# Patient Record
Sex: Female | Born: 1960
Health system: Southern US, Community
[De-identification: ages and names within clinical notes are randomized; demographics above are authoritative.]

## PROBLEM LIST (undated history)

## (undated) DIAGNOSIS — E039 Hypothyroidism, unspecified: Secondary | ICD-10-CM

## (undated) DIAGNOSIS — K219 Gastro-esophageal reflux disease without esophagitis: Secondary | ICD-10-CM

## (undated) HISTORY — PX: TMJ ARTHROSCOPY: SHX1067

## (undated) HISTORY — PX: COLONOSCOPY W/ BIOPSIES AND POLYPECTOMY: SHX1376

---

## 2011-07-29 ENCOUNTER — Other Ambulatory Visit: Payer: Self-pay | Admitting: Endocrinology

## 2011-07-29 DIAGNOSIS — E041 Nontoxic single thyroid nodule: Secondary | ICD-10-CM

## 2011-08-25 ENCOUNTER — Inpatient Hospital Stay: Admission: RE | Admit: 2011-08-25 | Payer: Self-pay | Source: Ambulatory Visit

## 2012-08-20 ENCOUNTER — Other Ambulatory Visit: Payer: Self-pay | Admitting: Endocrinology

## 2012-08-20 DIAGNOSIS — E041 Nontoxic single thyroid nodule: Secondary | ICD-10-CM

## 2012-08-31 ENCOUNTER — Ambulatory Visit
Admission: RE | Admit: 2012-08-31 | Discharge: 2012-08-31 | Disposition: A | Discharge status: Home / Self Care | Payer: 59 | Source: Ambulatory Visit | Attending: Endocrinology | Admitting: Endocrinology

## 2012-08-31 DIAGNOSIS — E041 Nontoxic single thyroid nodule: Secondary | ICD-10-CM

## 2013-05-06 ENCOUNTER — Other Ambulatory Visit: Payer: Self-pay | Admitting: Gastroenterology

## 2013-05-06 DIAGNOSIS — R110 Nausea: Secondary | ICD-10-CM

## 2013-05-06 DIAGNOSIS — R1013 Epigastric pain: Secondary | ICD-10-CM

## 2013-05-16 ENCOUNTER — Ambulatory Visit (HOSPITAL_COMMUNITY)
Admission: RE | Admit: 2013-05-16 | Discharge: 2013-05-16 | Disposition: A | Discharge status: Home / Self Care | Payer: 59 | Source: Ambulatory Visit | Attending: Gastroenterology | Admitting: Gastroenterology

## 2013-05-16 DIAGNOSIS — R1013 Epigastric pain: Secondary | ICD-10-CM

## 2013-05-16 DIAGNOSIS — R110 Nausea: Secondary | ICD-10-CM

## 2013-05-16 DIAGNOSIS — K8020 Calculus of gallbladder without cholecystitis without obstruction: Secondary | ICD-10-CM | POA: Insufficient documentation

## 2013-05-24 ENCOUNTER — Ambulatory Visit (INDEPENDENT_AMBULATORY_CARE_PROVIDER_SITE_OTHER): Payer: 59 | Admitting: General Surgery

## 2013-05-25 ENCOUNTER — Ambulatory Visit (HOSPITAL_COMMUNITY): Payer: 59

## 2013-05-30 ENCOUNTER — Other Ambulatory Visit (HOSPITAL_COMMUNITY): Payer: 59

## 2013-06-07 ENCOUNTER — Ambulatory Visit (INDEPENDENT_AMBULATORY_CARE_PROVIDER_SITE_OTHER): Payer: 59 | Admitting: General Surgery

## 2013-06-07 ENCOUNTER — Encounter (INDEPENDENT_AMBULATORY_CARE_PROVIDER_SITE_OTHER): Payer: Self-pay | Admitting: General Surgery

## 2013-06-07 VITALS — BP 122/84 | HR 72 | Resp 14 | Ht 65.0 in | Wt 158.4 lb

## 2013-06-07 DIAGNOSIS — K8020 Calculus of gallbladder without cholecystitis without obstruction: Secondary | ICD-10-CM | POA: Insufficient documentation

## 2013-06-07 NOTE — Progress Notes (Signed)
Patient ID: Amy Mercado, female   DOB: 01/02/1961, 52 y.o.   MRN: [id number]  Chief Complaint  Patient presents with  . Cholelithiasis    new pt    HPI Amy Mercado is a 52 y.o. female.  The patient comes in for abdominal pain extending over the past 2 years.  HPI The patient recently had an ultrasound demonstrating gallstones with no evidence of acute cholecystitis or obstruction. We'll the past 2 years she's had a workup for this relapsing abdominal pain in the epigastrium radiating sometimes to her back in the right upper quadrant. Over the past 2 months she's had nausea associated with this discomfort. She has had occasional vomiting. She's had no fevers or chills. She's had no diarrhea and no jaundice. The patient is chronically constipated and has recently had an upper GI endoscopy and a colonoscopy both of which demonstrated only benign polyps.  After ultrasound was done a surgical consultation was obtained and the patient was brought in for evaluation. No past medical history on file.  Past Surgical History  Procedure Laterality Date  . Cesarean section    . Tmj arthroscopy      No family history on file.  Social History History  Substance Use Topics  . Smoking status: Never Smoker   . Smokeless tobacco: Not on file  . Alcohol Use: Not on file    No Known Allergies  Current Outpatient Prescriptions  Medication Sig Dispense Refill  . busPIRone (BUSPAR) 15 MG tablet       . levothyroxine (SYNTHROID, LEVOTHROID) 125 MCG tablet       . pantoprazole (PROTONIX) 40 MG tablet       . polyethylene glycol-electrolytes (NULYTELY/GOLYTELY) 420 G solution       . Saccharomyces boulardii (FLORASTOR PO) Take by mouth.      . traMADol (ULTRAM) 50 MG tablet        No current facility-administered medications for this visit.    Review of Systems Review of Systems  Constitutional: Positive for appetite change. Negative for fever and chills.  HENT: Negative.   Eyes: Negative.    Respiratory: Negative.   Cardiovascular: Positive for chest pain (epigastric).  Gastrointestinal: Positive for nausea, abdominal pain and constipation.  Endocrine: Negative.   Genitourinary: Negative.   Allergic/Immunologic: Negative.   Neurological: Negative.   Hematological: Negative.   Psychiatric/Behavioral: Negative.     Blood pressure 122/84, pulse 72, resp. rate 14, height 5' 5" (1.651 m), weight 158 lb 6.4 oz (71.85 kg).  Physical Exam Physical Exam  Constitutional: She is oriented to person, place, and time. She appears well-developed and well-nourished.  HENT:  Head: Normocephalic and atraumatic.  Eyes: Conjunctivae and EOM are normal. Pupils are equal, round, and reactive to light.  Cardiovascular: Normal rate and normal heart sounds.   Pulmonary/Chest: Effort normal and breath sounds normal.  Abdominal: Soft. Bowel sounds are normal. There is tenderness in the right upper quadrant and epigastric area.  Musculoskeletal: Normal range of motion.  Neurological: She is alert and oriented to person, place, and time. She has normal reflexes.  Skin: Skin is warm and dry.  Psychiatric: She has a normal mood and affect. Her behavior is normal. Judgment and thought content normal.    Data Reviewed The results from her laboratory studies done by her primary care physician and referring doctor were reviewed.  Assessment    Likely symptomatic cholelithiasis with intermittent biliary colic.  Do not believe the patient has had any problems with   acute cholecystitis.     Plan    I have recommended that the patient have a laparoscopic cholecystectomy with a cholangiogram. The patient and her husband wish to think about this and get back with us. I will go ahead and put in orders for surgery in the patient can call back to schedule.        Helon Wisinski O 06/07/2013, 9:00 AM    

## 2013-06-09 ENCOUNTER — Telehealth (INDEPENDENT_AMBULATORY_CARE_PROVIDER_SITE_OTHER): Payer: Self-pay

## 2013-06-09 NOTE — Telephone Encounter (Signed)
Pt calling to schedule her surgery.  Has many questions about the cost, etc.  Told her that surgery scheduling will contact her as soon as possible.

## 2013-06-17 ENCOUNTER — Encounter (HOSPITAL_COMMUNITY): Payer: Self-pay | Admitting: Pharmacy Technician

## 2013-06-22 ENCOUNTER — Encounter (HOSPITAL_COMMUNITY)
Admission: RE | Admit: 2013-06-22 | Discharge: 2013-06-22 | Disposition: A | Discharge status: Home / Self Care | Payer: 59 | Source: Ambulatory Visit | Attending: General Surgery | Admitting: General Surgery

## 2013-06-22 ENCOUNTER — Encounter (HOSPITAL_COMMUNITY): Payer: Self-pay

## 2013-06-22 DIAGNOSIS — Z01812 Encounter for preprocedural laboratory examination: Secondary | ICD-10-CM | POA: Insufficient documentation

## 2013-06-22 HISTORY — DX: Gastro-esophageal reflux disease without esophagitis: K21.9

## 2013-06-22 HISTORY — DX: Hypothyroidism, unspecified: E03.9

## 2013-06-22 LAB — COMPREHENSIVE METABOLIC PANEL
ALT: 9 U/L (ref 0–35)
AST: 14 U/L (ref 0–37)
Albumin: 3.8 g/dL (ref 3.5–5.2)
Alkaline Phosphatase: 49 U/L (ref 39–117)
BILIRUBIN TOTAL: 0.3 mg/dL (ref 0.3–1.2)
BUN: 9 mg/dL (ref 6–23)
CO2: 24 mEq/L (ref 19–32)
CREATININE: 0.72 mg/dL (ref 0.50–1.10)
Calcium: 8.9 mg/dL (ref 8.4–10.5)
Chloride: 101 mEq/L (ref 96–112)
GFR calc non Af Amer: >90 mL/min (ref >90)
Glucose, Bld: 101 mg/dL — ABNORMAL HIGH (ref 70–99)
Potassium: 3.7 mEq/L (ref 3.7–5.3)
Sodium: 139 mEq/L (ref 137–147)
TOTAL PROTEIN: 7.3 g/dL (ref 6.0–8.3)

## 2013-06-22 LAB — CBC WITH DIFFERENTIAL/PLATELET
Basophils Absolute: 0 10*3/uL (ref 0.0–0.1)
Basophils Relative: 0 % (ref 0–1)
EOS ABS: 0 10*3/uL (ref 0.0–0.7)
Eosinophils Relative: 0 % (ref 0–5)
HEMATOCRIT: 36.1 % (ref 36.0–46.0)
HEMOGLOBIN: 12.1 g/dL (ref 12.0–15.0)
Lymphocytes Relative: 37 % (ref 12–46)
Lymphs Abs: 2.9 10*3/uL (ref 0.7–4.0)
MCH: 29.9 pg (ref 26.0–34.0)
MCHC: 33.5 g/dL (ref 30.0–36.0)
MCV: 89.1 fL (ref 78.0–100.0)
MONO ABS: 0.4 10*3/uL (ref 0.1–1.0)
MONOS PCT: 5 % (ref 3–12)
Neutro Abs: 4.4 10*3/uL (ref 1.7–7.7)
Neutrophils Relative %: 58 % (ref 43–77)
Platelets: 257 10*3/uL (ref 150–400)
RBC: 4.05 MIL/uL (ref 3.87–5.11)
RDW: 13.7 % (ref 11.5–15.5)
WBC: 7.7 10*3/uL (ref 4.0–10.5)

## 2013-06-22 LAB — HCG, SERUM, QUALITATIVE: Preg, Serum: NEGATIVE

## 2013-06-22 NOTE — Pre-Procedure Instructions (Signed)
Amy Mercado  06/22/2013   Your procedure is scheduled on: Wednesday, June 29, 2013 at 10:00 AM  [facility] (use Main Entrance "A'') at 8:00 AM.  Call this number if you have problems the morning of surgery: [phone]   Remember:   Do not eat food or drink liquids after midnight.   Take these medicines the morning of surgery with A SIP OF WATER: busPIRone (BUSPAR), levothyroxine (SYNTHROID, LEVOTHROID), pantoprazole (PROTONIX Stop taking Aspirin, vitamins and herbal medications. Do not take any NSAIDs ie: Ibuprofen, Advil, Naproxen or any medication containing Aspirin.  Do not wear jewelry, make-up or nail polish.  Do not wear lotions, powders, or perfumes. You may NOT wear deodorant.  Do not shave 48 hours prior to surgery.   Do not bring valuables to the hospital.  [facility] is not responsible for any belongings or valuables.               Contacts, dentures or bridgework may not be worn into surgery.  Leave suitcase in the car. After surgery it may be brought to your room.  For patients admitted to the hospital, discharge time is determined by your treatment team.               Patients discharged the day of surgery will not be allowed to drive home.  Name and phone number of your driver:   Special Instructions:  Special Instructions:Special Instructions: [facility] - Preparing for Surgery  Before surgery, you can play an important role.  Because skin is not sterile, your skin needs to be as free of germs as possible.  You can reduce the number of germs on you skin by washing with CHG (chlorahexidine gluconate) soap before surgery.  CHG is an antiseptic cleaner which kills germs and bonds with the skin to continue killing germs even after washing.  Please DO NOT use if you have an allergy to CHG or antibacterial soaps.  If your skin becomes reddened/irritated stop using the CHG and inform your nurse when you arrive at Short Stay.  Do not shave (including  legs and underarms) for at least 48 hours prior to the first CHG shower.  You may shave your face.  Please follow these instructions carefully:   1.  Shower with CHG Soap the night before surgery and the morning of Surgery.  2.  If you choose to wash your hair, wash your hair first as usual with your normal shampoo.  3.  After you shampoo, rinse your hair and body thoroughly to remove the Shampoo.  4.  Use CHG as you would any other liquid soap.  You can apply chg directly  to the skin and wash gently with scrungie or a clean washcloth.  5.  Apply the CHG Soap to your body ONLY FROM THE NECK DOWN.  Do not use on open wounds or open sores.  Avoid contact with your eyes, ears, mouth and genitals (private parts).  Wash genitals (private parts) with your normal soap.  6.  Wash thoroughly, paying special attention to the area where your surgery will be performed.  7.  Thoroughly rinse your body with warm water from the neck down.  8.  DO NOT shower/wash with your normal soap after using and rinsing off the CHG Soap.  9.  Pat yourself dry with a clean towel.            10.  Wear clean pajamas.  11.  Place clean sheets on your bed the night of your first shower and do not sleep with pets.  Day of Surgery  Do not apply any lotions/deodorants the morning of surgery.  Please wear clean clothes to the hospital/surgery center.   Please read over the following fact sheets that you were given: Pain Booklet, Coughing and Deep Breathing and Surgical Site Infection Prevention

## 2013-06-23 ENCOUNTER — Encounter (INDEPENDENT_AMBULATORY_CARE_PROVIDER_SITE_OTHER): Payer: Self-pay

## 2013-06-23 NOTE — Progress Notes (Signed)
Called Amy Mercado today to see if she had found the physician's name or where she had her stress test and Echo done. She states she has not had a chance to do that yet, but will tonight and call me with the info in the AM.

## 2013-06-27 NOTE — Progress Notes (Addendum)
[facility] Was told by that office to call [facility] bc the [facility] have [facility]

## 2013-06-28 MED ORDER — DEXTROSE 5 % IV SOLN
2.0000 g | INTRAVENOUS | Status: AC
  Administered 2013-06-29: 2 g via INTRAVENOUS
  Filled 2013-06-28: qty 2

## 2013-06-29 ENCOUNTER — Encounter (HOSPITAL_COMMUNITY): Payer: Self-pay | Admitting: Certified Registered"

## 2013-06-29 ENCOUNTER — Ambulatory Visit (HOSPITAL_COMMUNITY): Payer: 59 | Admitting: Certified Registered"

## 2013-06-29 ENCOUNTER — Encounter (HOSPITAL_COMMUNITY): Payer: 59 | Admitting: Certified Registered"

## 2013-06-29 ENCOUNTER — Ambulatory Visit (HOSPITAL_COMMUNITY): Payer: 59

## 2013-06-29 ENCOUNTER — Ambulatory Visit (HOSPITAL_COMMUNITY)
Admission: RE | Admit: 2013-06-29 | Discharge: 2013-06-29 | Disposition: A | Discharge status: Home / Self Care | Payer: 59 | Source: Ambulatory Visit | Attending: General Surgery | Admitting: General Surgery

## 2013-06-29 ENCOUNTER — Encounter (HOSPITAL_COMMUNITY)
Admission: RE | Disposition: A | Discharge status: Home / Self Care | Payer: Self-pay | Source: Ambulatory Visit | Attending: General Surgery

## 2013-06-29 DIAGNOSIS — K8020 Calculus of gallbladder without cholecystitis without obstruction: Secondary | ICD-10-CM

## 2013-06-29 DIAGNOSIS — K219 Gastro-esophageal reflux disease without esophagitis: Secondary | ICD-10-CM | POA: Insufficient documentation

## 2013-06-29 DIAGNOSIS — E039 Hypothyroidism, unspecified: Secondary | ICD-10-CM | POA: Insufficient documentation

## 2013-06-29 DIAGNOSIS — Z87891 Personal history of nicotine dependence: Secondary | ICD-10-CM | POA: Insufficient documentation

## 2013-06-29 DIAGNOSIS — K8010 Calculus of gallbladder with chronic cholecystitis without obstruction: Secondary | ICD-10-CM

## 2013-06-29 HISTORY — PX: CHOLECYSTECTOMY: SHX55

## 2013-06-29 SURGERY — LAPAROSCOPIC CHOLECYSTECTOMY WITH INTRAOPERATIVE CHOLANGIOGRAM
Anesthesia: General | Site: Abdomen

## 2013-06-29 MED ORDER — ROCURONIUM BROMIDE 50 MG/5ML IV SOLN
INTRAVENOUS | Status: AC
  Filled 2013-06-29: qty 1

## 2013-06-29 MED ORDER — EPHEDRINE SULFATE 50 MG/ML IJ SOLN
INTRAMUSCULAR | Status: AC
  Filled 2013-06-29: qty 1

## 2013-06-29 MED ORDER — ONDANSETRON HCL 4 MG/2ML IJ SOLN: 4.0000 mg | Freq: Four times a day (QID) | INTRAMUSCULAR | Status: DC | PRN

## 2013-06-29 MED ORDER — MIDAZOLAM HCL 5 MG/5ML IJ SOLN
INTRAMUSCULAR | Status: DC | PRN
  Administered 2013-06-29: 2 mg via INTRAVENOUS

## 2013-06-29 MED ORDER — HYDROMORPHONE HCL PF 1 MG/ML IJ SOLN
INTRAMUSCULAR | Status: AC
  Filled 2013-06-29: qty 1

## 2013-06-29 MED ORDER — GLYCOPYRROLATE 0.2 MG/ML IJ SOLN
INTRAMUSCULAR | Status: DC | PRN
  Administered 2013-06-29: 0.4 mg via INTRAVENOUS

## 2013-06-29 MED ORDER — ONDANSETRON HCL 4 MG/2ML IJ SOLN
INTRAMUSCULAR | Status: DC | PRN
  Administered 2013-06-29: 4 mg via INTRAVENOUS

## 2013-06-29 MED ORDER — PHENYLEPHRINE HCL 10 MG/ML IJ SOLN
INTRAMUSCULAR | Status: DC | PRN
  Administered 2013-06-29: 160 ug via INTRAVENOUS

## 2013-06-29 MED ORDER — 0.9 % SODIUM CHLORIDE (POUR BTL) OPTIME
TOPICAL | Status: DC | PRN
  Administered 2013-06-29: 1000 mL

## 2013-06-29 MED ORDER — ONDANSETRON HCL 4 MG/2ML IJ SOLN
INTRAMUSCULAR | Status: AC
  Filled 2013-06-29: qty 2

## 2013-06-29 MED ORDER — LIDOCAINE HCL (CARDIAC) 20 MG/ML IV SOLN
INTRAVENOUS | Status: AC
  Filled 2013-06-29: qty 5

## 2013-06-29 MED ORDER — IOHEXOL 300 MG/ML  SOLN
INTRAMUSCULAR | Status: DC | PRN
  Administered 2013-06-29: 10:00:00

## 2013-06-29 MED ORDER — PROPOFOL 10 MG/ML IV BOLUS
INTRAVENOUS | Status: AC
Start: 2013-06-29 — End: 2013-06-29
  Filled 2013-06-29: qty 20

## 2013-06-29 MED ORDER — OXYCODONE HCL 5 MG PO TABS
ORAL_TABLET | ORAL | Status: AC
  Filled 2013-06-29: qty 1

## 2013-06-29 MED ORDER — BUPIVACAINE-EPINEPHRINE PF 0.25-1:200000 % IJ SOLN
INTRAMUSCULAR | Status: DC | PRN
Start: 2013-06-29 — End: 2013-06-29
  Administered 2013-06-29: 30 mL

## 2013-06-29 MED ORDER — NEOSTIGMINE METHYLSULFATE 1 MG/ML IJ SOLN
INTRAMUSCULAR | Status: AC
  Filled 2013-06-29: qty 10

## 2013-06-29 MED ORDER — GLYCOPYRROLATE 0.2 MG/ML IJ SOLN
INTRAMUSCULAR | Status: AC
  Filled 2013-06-29: qty 2

## 2013-06-29 MED ORDER — PROPOFOL 10 MG/ML IV BOLUS
INTRAVENOUS | Status: DC | PRN
  Administered 2013-06-29: 180 mg via INTRAVENOUS

## 2013-06-29 MED ORDER — SODIUM CHLORIDE 0.9 % IR SOLN
Status: DC | PRN
  Administered 2013-06-29: 1000 mL

## 2013-06-29 MED ORDER — OXYCODONE HCL 5 MG PO TABS
5.0000 mg | ORAL_TABLET | Freq: Once | ORAL | Status: AC | PRN
  Administered 2013-06-29: 5 mg via ORAL

## 2013-06-29 MED ORDER — MIDAZOLAM HCL 2 MG/2ML IJ SOLN
INTRAMUSCULAR | Status: AC
  Filled 2013-06-29: qty 2

## 2013-06-29 MED ORDER — HYDROCODONE-ACETAMINOPHEN 5-325 MG PO TABS: 1.0000 | ORAL_TABLET | ORAL | Status: DC | PRN

## 2013-06-29 MED ORDER — SUCCINYLCHOLINE CHLORIDE 20 MG/ML IJ SOLN
INTRAMUSCULAR | Status: AC
  Filled 2013-06-29: qty 1

## 2013-06-29 MED ORDER — CHLORHEXIDINE GLUCONATE 4 % EX LIQD
1.0000 "application " | Freq: Once | CUTANEOUS | Status: DC
  Filled 2013-06-29: qty 15

## 2013-06-29 MED ORDER — PHENYLEPHRINE 40 MCG/ML (10ML) SYRINGE FOR IV PUSH (FOR BLOOD PRESSURE SUPPORT)
PREFILLED_SYRINGE | INTRAVENOUS | Status: AC
  Filled 2013-06-29: qty 10

## 2013-06-29 MED ORDER — FENTANYL CITRATE 0.05 MG/ML IJ SOLN
INTRAMUSCULAR | Status: AC
  Filled 2013-06-29: qty 5

## 2013-06-29 MED ORDER — BUPIVACAINE-EPINEPHRINE (PF) 0.25% -1:200000 IJ SOLN
INTRAMUSCULAR | Status: AC
  Filled 2013-06-29: qty 30

## 2013-06-29 MED ORDER — OXYCODONE HCL 5 MG/5ML PO SOLN: 5.0000 mg | Freq: Once | ORAL | Status: AC | PRN

## 2013-06-29 MED ORDER — LACTATED RINGERS IV SOLN
INTRAVENOUS | Status: DC
  Administered 2013-06-29 (×2): via INTRAVENOUS

## 2013-06-29 MED ORDER — LIDOCAINE HCL (CARDIAC) 20 MG/ML IV SOLN
INTRAVENOUS | Status: DC | PRN
  Administered 2013-06-29: 80 mg via INTRAVENOUS

## 2013-06-29 MED ORDER — NEOSTIGMINE METHYLSULFATE 1 MG/ML IJ SOLN
INTRAMUSCULAR | Status: DC | PRN
  Administered 2013-06-29: 3 mg via INTRAVENOUS

## 2013-06-29 MED ORDER — EPHEDRINE SULFATE 50 MG/ML IJ SOLN
INTRAMUSCULAR | Status: DC | PRN
  Administered 2013-06-29: 10 mg via INTRAVENOUS

## 2013-06-29 MED ORDER — SODIUM CHLORIDE 0.9 % IJ SOLN
INTRAMUSCULAR | Status: AC
  Filled 2013-06-29: qty 10

## 2013-06-29 MED ORDER — ROCURONIUM BROMIDE 100 MG/10ML IV SOLN
INTRAVENOUS | Status: DC | PRN
  Administered 2013-06-29: 50 mg via INTRAVENOUS

## 2013-06-29 MED ORDER — FENTANYL CITRATE 0.05 MG/ML IJ SOLN
INTRAMUSCULAR | Status: DC | PRN
  Administered 2013-06-29: 100 ug via INTRAVENOUS
  Administered 2013-06-29 (×3): 50 ug via INTRAVENOUS

## 2013-06-29 MED ORDER — HYDROMORPHONE HCL PF 1 MG/ML IJ SOLN
0.2500 mg | INTRAMUSCULAR | Status: DC | PRN
  Administered 2013-06-29: 0.25 mg via INTRAVENOUS

## 2013-06-29 SURGICAL SUPPLY — 42 items
APPLIER CLIP 5 13 M/L LIGAMAX5 (MISCELLANEOUS)
APPLIER CLIP ROT 10 11.4 M/L (STAPLE)
BLADE SURG ROTATE 9660 (MISCELLANEOUS) IMPLANT
CANISTER SUCTION 2500CC (MISCELLANEOUS) ×2 IMPLANT
CHLORAPREP W/TINT 26ML (MISCELLANEOUS) ×2 IMPLANT
CLIP APPLIE 5 13 M/L LIGAMAX5 (MISCELLANEOUS) IMPLANT
CLIP APPLIE ROT 10 11.4 M/L (STAPLE) IMPLANT
COVER MAYO STAND STRL (DRAPES) ×2 IMPLANT
COVER SURGICAL LIGHT HANDLE (MISCELLANEOUS) ×2 IMPLANT
DERMABOND ADVANCED (GAUZE/BANDAGES/DRESSINGS) ×1
DERMABOND ADVANCED .7 DNX12 (GAUZE/BANDAGES/DRESSINGS) ×1 IMPLANT
DRAPE C-ARM 42X72 X-RAY (DRAPES) ×2 IMPLANT
DRAPE UTILITY 15X26 W/TAPE STR (DRAPE) ×4 IMPLANT
DRSG TEGADERM 2-3/8X2-3/4 SM (GAUZE/BANDAGES/DRESSINGS) ×8 IMPLANT
ELECT REM PT RETURN 9FT ADLT (ELECTROSURGICAL) ×2
ELECTRODE REM PT RTRN 9FT ADLT (ELECTROSURGICAL) ×1 IMPLANT
GLOVE BIO SURGEON STRL SZ7 (GLOVE) ×4 IMPLANT
GLOVE BIOGEL PI IND STRL 7.0 (GLOVE) ×3 IMPLANT
GLOVE BIOGEL PI IND STRL 8 (GLOVE) ×1 IMPLANT
GLOVE BIOGEL PI INDICATOR 7.0 (GLOVE) ×3
GLOVE BIOGEL PI INDICATOR 8 (GLOVE) ×1
GLOVE ECLIPSE 7.5 STRL STRAW (GLOVE) ×2 IMPLANT
GLOVE SURG SS PI 7.0 STRL IVOR (GLOVE) ×4 IMPLANT
GOWN STRL REUS W/ TWL LRG LVL3 (GOWN DISPOSABLE) ×3 IMPLANT
GOWN STRL REUS W/TWL LRG LVL3 (GOWN DISPOSABLE) ×3
KIT BASIN OR (CUSTOM PROCEDURE TRAY) ×2 IMPLANT
KIT ROOM TURNOVER OR (KITS) ×2 IMPLANT
NS IRRIG 1000ML POUR BTL (IV SOLUTION) ×2 IMPLANT
PAD ARMBOARD 7.5X6 YLW CONV (MISCELLANEOUS) ×2 IMPLANT
POUCH SPECIMEN RETRIEVAL 10MM (ENDOMECHANICALS) IMPLANT
SCISSORS LAP 5X35 DISP (ENDOMECHANICALS) ×2 IMPLANT
SET CHOLANGIOGRAPH 5 50 .035 (SET/KITS/TRAYS/PACK) ×2 IMPLANT
SET IRRIG TUBING LAPAROSCOPIC (IRRIGATION / IRRIGATOR) ×2 IMPLANT
SLEEVE ENDOPATH XCEL 5M (ENDOMECHANICALS) ×2 IMPLANT
SPECIMEN JAR SMALL (MISCELLANEOUS) ×2 IMPLANT
SUT MNCRL AB 4-0 PS2 18 (SUTURE) ×2 IMPLANT
TOWEL OR 17X24 6PK STRL BLUE (TOWEL DISPOSABLE) ×2 IMPLANT
TOWEL OR 17X26 10 PK STRL BLUE (TOWEL DISPOSABLE) ×2 IMPLANT
TRAY LAPAROSCOPIC (CUSTOM PROCEDURE TRAY) ×2 IMPLANT
TROCAR XCEL BLUNT TIP 100MML (ENDOMECHANICALS) ×2 IMPLANT
TROCAR XCEL NON-BLD 11X100MML (ENDOMECHANICALS) IMPLANT
TROCAR XCEL NON-BLD 5MMX100MML (ENDOMECHANICALS) ×2 IMPLANT

## 2013-06-29 NOTE — Anesthesia Procedure Notes (Signed)
Procedure Name: Intubation Date/Time: 06/29/2013 10:05 AM Performed by: Jerilee HohMUMM, Sakiyah Shur N Pre-anesthesia Checklist: Patient identified, Emergency Drugs available, Suction available and Patient being monitored Patient Re-evaluated:Patient Re-evaluated prior to inductionOxygen Delivery Method: Circle system utilized Preoxygenation: Pre-oxygenation with 100% oxygen Intubation Type: IV induction Ventilation: Mask ventilation without difficulty Laryngoscope Size: Mac and 3 Grade View: Grade I Tube type: Oral Tube size: 7.0 mm Number of attempts: 1 Airway Equipment and Method: Stylet Placement Confirmation: ETT inserted through vocal cords under direct vision,  positive ETCO2 and breath sounds checked- equal and bilateral Secured at: 21 cm Tube secured with: Tape Dental Injury: Teeth and Oropharynx as per pre-operative assessment

## 2013-06-29 NOTE — Op Note (Signed)
OPERATIVE REPORT  DATE OF OPERATION: 06/29/2013  PATIENT:  Amy Mercado  53 y.o. female  PRE-OPERATIVE DIAGNOSIS:  Symptomatic cholelithiasis  POST-OPERATIVE DIAGNOSIS:  Symptomatic cholelithiasis  PROCEDURE:  Procedure(s): LAPAROSCOPIC CHOLECYSTECTOMY WITH INTRAOPERATIVE CHOLANGIOGRAM  SURGEON:  Surgeon(s): Cherylynn RidgesJames O Reiley Keisler, MD  ASSISTANT: None  ANESTHESIA:   general  EBL: <20 ml  BLOOD ADMINISTERED: none  DRAINS: none   SPECIMEN:  Source of Specimen:  Gallbladder and stones  COUNTS CORRECT:  YES  PROCEDURE DETAILS: The patient was taken to the operating room and placed on the table in the supine position.  After an adequate endotracheal anesthetic was administered, she was prepped with ChloroPrep, and then draped in the usual manner exposing the entire abdomen laterally, inferiorly and up  to the costal margins.  After a proper timeout was performed including identifying the patient and the procedure to be performed, a supraumbilical 1.5cm midline incision was made using a #15 blade.  This was taken down to the fascia which was then incised with a #15 blade.  The edges of the fascia were tented up with Kocher clamps as the preperitoneal space was penetrated with a Kelly clamp into the peritoneum.  Once this was done, a pursestring suture of 0 Vicryl was passed around the fascial opening.  This was subsequently used to secure the [facility] cannula which was passed into the peritoneal cavity.  Once the [facility] cannula was in place, carbon dioxide gas was insufflated into the peritoneal cavity up to a maximal intra-abdominal pressure of 15mm Hg.The laparoscope, with attached camera and light source, was passed into the peritoneal cavity to visualize the direct insertion of two right upper quadrant 5mm cannulas, and a sup-xiphoid 5mm cannula.  Once all cannulas were in place, the dissection was begun.  Two ratcheted graspers were attached to the dome and infundibulum of the gallbladder  and retracted towards the anterior abdominal wall and the right upper quadrant.  Using cautery attached to a dissecting forceps, the peritoneum overlaying the triangle of Chalot and the hepatoduodenal triangle was dissected away exposing the cystic duct and the cystic artery.  A clip was placed on the gallbladder side of the cystic duct, then a cholecytodochotomy made using the laparoscopic scissors.  Through the cholecystodochotomy a Cook catheter was passed to performed a cholangiogram.  The cholangiogram showed good proximal filling, no intraductal filling defects, good distal flow into the duodenum, and no biliary dilatation..  Once the cholangiogram was completed, the [place] catheter was removed, and the distal cystic duct was clipped multiple times then transected.  The gallbladder was then dissected out of the hepatic bed without event.  It was retrieved from the abdomen without event.  Once the gallbladder was removed, the bed was inspected for hemostasis.  Once excellent hemostasis was obtained all gas and fluids were aspirated from above the liver, then the cannulas were removed.  The supraumbilical incision was closed using the pursestring suture which was in place.  0.25% bupivicaine with epinephrine was injected at all sites.  All 10mm or greater cannula sites were close using a running subcuticular stitch of 4-0 Monocryl.  5.420mm cannula sites were closed with Dermabond only.Steri-Strips and Tagaderm were used to complete the dressings at all sites.  At this point all needle, sponge, and instrument counts were correct.The patient was awakened from anesthesia and taken to the PACU in stable condition.  PATIENT DISPOSITION:  PACU - hemodynamically stable.   Cherylynn RidgesJames O Jordell Outten 4/8/201511:11 AM

## 2013-06-29 NOTE — Interval H&P Note (Signed)
History and Physical Interval Note:  06/29/2013 8:33 AM  Amy Mercado  has presented today for surgery, with the diagnosis of Symptomatic cholelithiasis  The various methods of treatment have been discussed with the patient and family. After consideration of risks, benefits and other options for treatment, the patient has consented to  Procedure(s): LAPAROSCOPIC CHOLECYSTECTOMY WITH INTRAOPERATIVE CHOLANGIOGRAM (N/A) as a surgical intervention .  The patient's history has been reviewed, patient examined, no change in status, stable for surgery.  I have reviewed the patient's chart and labs.  Questions were answered to the patient's satisfaction.    Patient is nervous as expected.  LFTs completely normal.  IOC optional.     Cherylynn RidgesJames O Machael Raine

## 2013-06-29 NOTE — Transfer of Care (Signed)
Immediate Anesthesia Transfer of Care Note  Patient: Amy Mercado  Procedure(s) Performed: Procedure(s): LAPAROSCOPIC CHOLECYSTECTOMY WITH INTRAOPERATIVE CHOLANGIOGRAM (N/A)  Patient Location: PACU  Anesthesia Type:General  Level of Consciousness: awake, alert , oriented and patient cooperative  Airway & Oxygen Therapy: Patient Spontanous Breathing and Patient connected to nasal cannula oxygen  Post-op Assessment: Report given to PACU RN, Post -op Vital signs reviewed and stable and Patient moving all extremities  Post vital signs: Reviewed and stable  Complications: No apparent anesthesia complications

## 2013-06-29 NOTE — H&P (View-Only) (Signed)
Patient ID: Amy Mercado, female   DOB: Jul 25, 1960, 53 y.o.   MRN: [id number]  Chief Complaint  Patient presents with  . Cholelithiasis    new pt    HPI Amy Mercado is a 53 y.o. female.  The patient comes in for abdominal pain extending over the past 2 years.  HPI The patient recently had an ultrasound demonstrating gallstones with no evidence of acute cholecystitis or obstruction. We'll the past 2 years she's had a workup for this relapsing abdominal pain in the epigastrium radiating sometimes to her back in the right upper quadrant. Over the past 2 months she's had nausea associated with this discomfort. She has had occasional vomiting. She's had no fevers or chills. She's had no diarrhea and no jaundice. The patient is chronically constipated and has recently had an upper GI endoscopy and a colonoscopy both of which demonstrated only benign polyps.  After ultrasound was done a surgical consultation was obtained and the patient was brought in for evaluation. No past medical history on file.  Past Surgical History  Procedure Laterality Date  . Cesarean section    . Tmj arthroscopy      No family history on file.  Social History History  Substance Use Topics  . Smoking status: Never Smoker   . Smokeless tobacco: Not on file  . Alcohol Use: Not on file    No Known Allergies  Current Outpatient Prescriptions  Medication Sig Dispense Refill  . busPIRone (BUSPAR) 15 MG tablet       . levothyroxine (SYNTHROID, LEVOTHROID) 125 MCG tablet       . pantoprazole (PROTONIX) 40 MG tablet       . polyethylene glycol-electrolytes (NULYTELY/GOLYTELY) 420 G solution       . Saccharomyces boulardii (FLORASTOR PO) Take by mouth.      . traMADol (ULTRAM) 50 MG tablet        No current facility-administered medications for this visit.    Review of Systems Review of Systems  Constitutional: Positive for appetite change. Negative for fever and chills.  HENT: Negative.   Eyes: Negative.    Respiratory: Negative.   Cardiovascular: Positive for chest pain (epigastric).  Gastrointestinal: Positive for nausea, abdominal pain and constipation.  Endocrine: Negative.   Genitourinary: Negative.   Allergic/Immunologic: Negative.   Neurological: Negative.   Hematological: Negative.   Psychiatric/Behavioral: Negative.     Blood pressure 122/84, pulse 72, resp. rate 14, height 5\' 5"  (1.651 m), weight 158 lb 6.4 oz (71.85 kg).  Physical Exam Physical Exam  Constitutional: She is oriented to person, place, and time. She appears well-developed and well-nourished.  HENT:  Head: Normocephalic and atraumatic.  Eyes: Conjunctivae and EOM are normal. Pupils are equal, round, and reactive to light.  Cardiovascular: Normal rate and normal heart sounds.   Pulmonary/Chest: Effort normal and breath sounds normal.  Abdominal: Soft. Bowel sounds are normal. There is tenderness in the right upper quadrant and epigastric area.  Musculoskeletal: Normal range of motion.  Neurological: She is alert and oriented to person, place, and time. She has normal reflexes.  Skin: Skin is warm and dry.  Psychiatric: She has a normal mood and affect. Her behavior is normal. Judgment and thought content normal.    Data Reviewed The results from her laboratory studies done by her primary care physician and referring doctor were reviewed.  Assessment    Likely symptomatic cholelithiasis with intermittent biliary colic.  Do not believe the patient has had any problems with  acute cholecystitis.     Plan    I have recommended that the patient have a laparoscopic cholecystectomy with a cholangiogram. The patient and her husband wish to think about this and get back with Amy Mercado. I will go ahead and put in orders for surgery in the patient can call back to schedule.        Cherylynn RidgesWYATT, Navneet Schmuck O 06/07/2013, 9:00 AM

## 2013-06-29 NOTE — Anesthesia Preprocedure Evaluation (Signed)
Anesthesia Evaluation  Patient identified by MRN, date of birth, ID band Patient awake    Reviewed: Allergy & Precautions, H&P , NPO status , Patient's Chart, lab work & pertinent test results  Airway Mallampati: II  Neck ROM: full    Dental   Pulmonary former smoker,          Cardiovascular     Neuro/Psych    GI/Hepatic GERD-  ,  Endo/Other  Hypothyroidism   Renal/GU      Musculoskeletal   Abdominal   Peds  Hematology   Anesthesia Other Findings   Reproductive/Obstetrics                           Anesthesia Physical Anesthesia Plan  ASA: II  Anesthesia Plan: General   Post-op Pain Management:    Induction: Intravenous  Airway Management Planned: Oral ETT  Additional Equipment:   Intra-op Plan:   Post-operative Plan: Extubation in OR  Informed Consent: I have reviewed the patients History and Physical, chart, labs and discussed the procedure including the risks, benefits and alternatives for the proposed anesthesia with the patient or authorized representative who has indicated his/her understanding and acceptance.     Plan Discussed with: CRNA, Anesthesiologist and Surgeon  Anesthesia Plan Comments:         Anesthesia Quick Evaluation

## 2013-06-29 NOTE — Discharge Instructions (Addendum)
Laparoscopic Cholecystectomy, Care After Refer to this sheet in the next few weeks. These instructions provide you with information on caring for yourself after your procedure. Your health care provider may also give you more specific instructions. Your treatment has been planned according to current medical practices, but problems sometimes occur. Call your health care provider if you have any problems or questions after your procedure. WHAT TO EXPECT AFTER THE PROCEDURE After your procedure, it is typical to have the following:  Pain at your incision sites. You will be given pain medicines to control the pain.  Mild nausea or vomiting. This should improve after the first 24 hours.  Bloating and possibly shoulder pain from the gas used during the procedure. This will improve after the first 24 hours. HOME CARE INSTRUCTIONS   Change bandages (dressings) as directed by your health care provider.  Keep the wound dry and clean, but your dressings are plastic and you may shower but not bathe.. You may wash the wound gently with soap and water. Gently blot or dab the area dry.  Do not take baths or use swimming pools or hot tubs for 2 weeks or until your health care provider approves.  Only take over-the-counter or prescription medicines as directed by your health care provider.  Continue your normal diet as directed by your health care provider.  Do not lift anything heavier than 20 pounds (9.0kg) until your health care provider approves.  Do not play contact sports for 1 week or until your health care provider approves.   SEEK MEDICAL CARE IF:   You have redness, swelling, or increasing pain in the wound.  You notice yellowish-white fluid (pus) coming from the wound.  You have drainage from the wound that lasts longer than 1 day.  You notice a bad smell coming from the wound or dressing.  Your surgical cuts (incisions) break open. SEEK IMMEDIATE MEDICAL CARE IF:   You develop a  rash.  You have difficulty breathing.  You have chest pain.  You have a fever.  You have increasing pain in the shoulders (shoulder strap areas).  You have dizzy episodes or faint while standing.  You have severe abdominal pain.  You feel sick to your stomach (nauseous) or throw up (vomit) and this lasts for more than 1 day. Document Released: 03/10/2005 Document Revised: 12/29/2012 Document Reviewed: 10/20/2012 [facility] Patient Information 2014 [place].

## 2013-06-29 NOTE — Anesthesia Postprocedure Evaluation (Signed)
Anesthesia Post Note  Patient: Amy Mercado  Procedure(s) Performed: Procedure(s) (LRB): LAPAROSCOPIC CHOLECYSTECTOMY WITH INTRAOPERATIVE CHOLANGIOGRAM (N/A)  Anesthesia type: General  Patient location: PACU  Post pain: Pain level controlled  Post assessment: Patient's Cardiovascular Status Stable  Last Vitals:  Filed Vitals:   06/29/13 1149  BP:   Pulse: 53  Temp: 36.4 C  Resp: 20    Post vital signs: Reviewed and stable  Level of consciousness: alert  Complications: No apparent anesthesia complications

## 2013-06-30 ENCOUNTER — Encounter (HOSPITAL_COMMUNITY): Payer: Self-pay | Admitting: General Surgery

## 2013-06-30 ENCOUNTER — Encounter (INDEPENDENT_AMBULATORY_CARE_PROVIDER_SITE_OTHER): Payer: Self-pay

## 2013-07-19 ENCOUNTER — Encounter (INDEPENDENT_AMBULATORY_CARE_PROVIDER_SITE_OTHER): Payer: 59 | Admitting: General Surgery

## 2013-08-02 ENCOUNTER — Ambulatory Visit (INDEPENDENT_AMBULATORY_CARE_PROVIDER_SITE_OTHER): Payer: 59 | Admitting: General Surgery

## 2013-08-02 ENCOUNTER — Encounter (INDEPENDENT_AMBULATORY_CARE_PROVIDER_SITE_OTHER): Payer: Self-pay | Admitting: General Surgery

## 2013-08-02 VITALS — BP 112/78 | HR 74 | Temp 97.6°F | Resp 12 | Ht 65.0 in | Wt 157.0 lb

## 2013-08-02 DIAGNOSIS — Z09 Encounter for follow-up examination after completed treatment for conditions other than malignant neoplasm: Secondary | ICD-10-CM | POA: Insufficient documentation

## 2013-08-02 NOTE — Progress Notes (Signed)
Subjective:     Patient ID: Amy Mercado, female   DOB: 04/29/1960, 53 y.o.   MRN: [id number]  HPI Patient doing very well.  Wants to lose weight.  Review of Systems No diarrheas.  Eating well    Objective:   Physical Exam Incisions are healing well without infections.  No abdominal tenderness or pain.    Assessment:     Status post lap chole, doing well     Plan:     Full activity.  RTC PRN.

## 2013-10-20 ENCOUNTER — Other Ambulatory Visit: Payer: Self-pay | Admitting: Endocrinology

## 2013-10-20 DIAGNOSIS — E039 Hypothyroidism, unspecified: Secondary | ICD-10-CM

## 2014-01-31 ENCOUNTER — Ambulatory Visit: Payer: Self-pay | Admitting: Internal Medicine

## 2014-10-20 ENCOUNTER — Ambulatory Visit
Admission: RE | Admit: 2014-10-20 | Discharge: 2014-10-20 | Disposition: A | Discharge status: Home / Self Care | Payer: BLUE CROSS/BLUE SHIELD | Source: Ambulatory Visit | Attending: Endocrinology | Admitting: Endocrinology

## 2014-10-20 DIAGNOSIS — E039 Hypothyroidism, unspecified: Secondary | ICD-10-CM

## 2014-10-23 ENCOUNTER — Other Ambulatory Visit: Payer: Self-pay | Admitting: Endocrinology

## 2014-10-27 ENCOUNTER — Encounter (HOSPITAL_BASED_OUTPATIENT_CLINIC_OR_DEPARTMENT_OTHER): Payer: Self-pay | Admitting: *Deleted

## 2014-10-27 ENCOUNTER — Emergency Department (HOSPITAL_BASED_OUTPATIENT_CLINIC_OR_DEPARTMENT_OTHER)
Admission: EM | Admit: 2014-10-27 | Discharge: 2014-10-27 | Disposition: A | Discharge status: Home / Self Care | Payer: BLUE CROSS/BLUE SHIELD | Attending: Emergency Medicine | Admitting: Emergency Medicine

## 2014-10-27 DIAGNOSIS — Z79899 Other long term (current) drug therapy: Secondary | ICD-10-CM | POA: Insufficient documentation

## 2014-10-27 DIAGNOSIS — Z8719 Personal history of other diseases of the digestive system: Secondary | ICD-10-CM | POA: Diagnosis not present

## 2014-10-27 DIAGNOSIS — Z23 Encounter for immunization: Secondary | ICD-10-CM | POA: Diagnosis not present

## 2014-10-27 DIAGNOSIS — E039 Hypothyroidism, unspecified: Secondary | ICD-10-CM | POA: Insufficient documentation

## 2014-10-27 DIAGNOSIS — Z87891 Personal history of nicotine dependence: Secondary | ICD-10-CM | POA: Insufficient documentation

## 2014-10-27 DIAGNOSIS — IMO0001 Reserved for inherently not codable concepts without codable children: Secondary | ICD-10-CM

## 2014-10-27 DIAGNOSIS — M79641 Pain in right hand: Secondary | ICD-10-CM | POA: Diagnosis present

## 2014-10-27 DIAGNOSIS — L03011 Cellulitis of right finger: Secondary | ICD-10-CM | POA: Diagnosis not present

## 2014-10-27 MED ORDER — TETANUS-DIPHTH-ACELL PERTUSSIS 5-2.5-18.5 LF-MCG/0.5 IM SUSP
0.5000 mL | Freq: Once | INTRAMUSCULAR | Status: AC
  Administered 2014-10-27: 0.5 mL via INTRAMUSCULAR
  Filled 2014-10-27: qty 0.5

## 2014-10-27 MED ORDER — CLINDAMYCIN HCL 150 MG PO CAPS: 300.0000 mg | ORAL_CAPSULE | Freq: Three times a day (TID) | ORAL | Status: AC

## 2014-10-27 MED ORDER — LIDOCAINE HCL (PF) 1 % IJ SOLN
10.0000 mL | Freq: Once | INTRAMUSCULAR | Status: AC
  Administered 2014-10-27: 5 mL
  Filled 2014-10-27: qty 10

## 2014-10-27 NOTE — Discharge Instructions (Signed)

## 2014-10-27 NOTE — ED Provider Notes (Signed)
CSN: [id number]     Arrival date & time 10/27/14  1437 History   First MD Initiated Contact with Patient 10/27/14 1458     Chief Complaint  Patient presents with  . Finger Injury     (Consider location/radiation/quality/duration/timing/severity/associated sxs/prior Treatment) Patient is a 54 y.o. female presenting with hand pain.  Hand Pain This is a new problem. The current episode started more than 2 days ago. The problem occurs constantly. The problem has not changed since onset.Pertinent negatives include no chest pain, no abdominal pain, no headaches and no shortness of breath. The symptoms are aggravated by bending. Nothing relieves the symptoms. Treatments tried: bacitracin. The treatment provided no relief.    Past Medical History  Diagnosis Date  . Hypothyroidism   . GERD (gastroesophageal reflux disease)    Past Surgical History  Procedure Laterality Date  . Cesarean section    . Tmj arthroscopy    . Colonoscopy w/ biopsies and polypectomy    . Cholecystectomy N/A 06/29/2013    Procedure: LAPAROSCOPIC CHOLECYSTECTOMY WITH INTRAOPERATIVE CHOLANGIOGRAM;  Surgeon: Cherylynn Ridges, MD;  Location: [facility] OR;  Service: General;  Laterality: N/A;   Family History  Problem Relation Age of Onset  . Diabetes    . Diabetes Mother   . Diabetes Father    History  Substance Use Topics  . Smoking status: Former Games developer  . Smokeless tobacco: Never Used     Comment: quit smoking cigarettes in 2010  . Alcohol Use: Not on file     Comment: rare   OB History    No data available     Review of Systems  Constitutional: Negative for fever.  HENT: Negative for sore throat.   Eyes: Negative for visual disturbance.  Respiratory: Negative for cough and shortness of breath.   Cardiovascular: Negative for chest pain.  Gastrointestinal: Negative for nausea, vomiting, abdominal pain and rectal pain.  Genitourinary: Negative for difficulty urinating.  Musculoskeletal: Negative for back pain and  neck pain.  Skin: Negative for rash.  Neurological: Negative for syncope and headaches.      Allergies  Review of patient's allergies indicates no known allergies.  Home Medications   Prior to Admission medications   Medication Sig Start Date End Date Taking? Authorizing Provider  MELOXICAM PO Take by mouth.   Yes Historical Provider, MD  clindamycin (CLEOCIN) 150 MG capsule Take 2 capsules (300 mg total) by mouth 3 (three) times daily. 10/27/14   Alvira Monday, MD  levothyroxine (SYNTHROID, LEVOTHROID) 125 MCG tablet Take 125 mcg by mouth daily. Except on sundays 06/01/13   Historical Provider, MD   BP 135/80 mmHg  Pulse 62  Temp(Src) 97.9 F (36.6 C) (Oral)  Resp 18  Ht  (1.676 m)  Wt 158 lb (71.668 kg)  BMI 25.51 kg/m2  SpO2 98%  LMP 06/20/2013 Physical Exam  Constitutional: She is oriented to person, place, and time. She appears well-developed and well-nourished. No distress.  HENT:  Head: Normocephalic and atraumatic.  Eyes: Conjunctivae and EOM are normal.  Neck: Normal range of motion.  Cardiovascular: Normal rate, regular rhythm, normal heart sounds and intact distal pulses.  Exam reveals no gallop and no friction rub.   No murmur heard. Pulmonary/Chest: Effort normal and breath sounds normal. No respiratory distress. She has no wheezes. She has no rales.  Abdominal: Soft. She exhibits no distension. There is no tenderness. There is no guarding.  Musculoskeletal: She exhibits no edema or tenderness.  Erythema right ring finger distally  surrounding nail to DIP, no fullness of pad of finger, purulent area along nail edge  Neurological: She is alert and oriented to person, place, and time.  Skin: Skin is warm and dry. No rash noted. She is not diaphoretic. No erythema.  Nursing note and vitals reviewed.   ED Course  INCISION AND DRAINAGE Date/Time: 10/28/2014 8:58 PM Performed by: Alvira Monday Authorized by: Alvira Monday Consent: Verbal consent  obtained. Risks and benefits: risks, benefits and alternatives were discussed Time out: Immediately prior to procedure a "time out" was called to verify the correct patient, procedure, equipment, support staff and site/side marked as required. Type: abscess (paronychia) Body area: upper extremity Location details: right ring finger Anesthesia: digital block Local anesthetic: lidocaine 1% without epinephrine Anesthetic total: 10 ml Patient sedated: no Scalpel size: 11 Incision type: along nail edge, lifted nail edge. Drainage: purulent Drainage amount: copious Wound treatment: wound left open Patient tolerance: Patient tolerated the procedure well with no immediate complications   (including critical care time) Labs Review Labs Reviewed - No data to display  Imaging Review No results found.   EKG Interpretation None      MDM   Final diagnoses:  Paronychia of fourth finger, right   54yo female with no significant medical history presents with concern of pain, swelling, erythema to distal right ring finger consistent with paronychia.  Given severity of swelling, paronychia was drained and nail edge was lifted with copious purulence expressed.  Given surrounding erythema, pt given clindamycin for concern of additional cellulitis. Recommend PCP follow up.  Patient discharged in stable condition with understanding of reasons to return.    Alvira Monday, MD 10/28/14 2103

## 2014-10-27 NOTE — ED Notes (Signed)
Pt amb to triage with quick steady gait smiling in nad. Pt reports swelling to her finger x Sunday, has been using neosporin cream, but finger cont to swell and hurt.

## 2015-09-13 ENCOUNTER — Other Ambulatory Visit: Payer: Self-pay | Admitting: Internal Medicine

## 2015-09-13 DIAGNOSIS — R319 Hematuria, unspecified: Secondary | ICD-10-CM

## 2015-09-20 ENCOUNTER — Ambulatory Visit
Admission: RE | Admit: 2015-09-20 | Discharge: 2015-09-20 | Disposition: A | Discharge status: Home / Self Care | Payer: Managed Care, Other (non HMO) | Source: Ambulatory Visit | Attending: Internal Medicine | Admitting: Internal Medicine

## 2015-09-20 DIAGNOSIS — R319 Hematuria, unspecified: Secondary | ICD-10-CM

## 2016-07-28 ENCOUNTER — Other Ambulatory Visit: Payer: Self-pay | Admitting: Obstetrics and Gynecology

## 2016-07-28 ENCOUNTER — Other Ambulatory Visit: Payer: Self-pay | Admitting: Internal Medicine

## 2016-07-28 DIAGNOSIS — Z1231 Encounter for screening mammogram for malignant neoplasm of breast: Secondary | ICD-10-CM

## 2016-09-15 ENCOUNTER — Ambulatory Visit
Admission: RE | Admit: 2016-09-15 | Discharge: 2016-09-15 | Disposition: A | Discharge status: Home / Self Care | Payer: Managed Care, Other (non HMO) | Source: Ambulatory Visit | Attending: Internal Medicine | Admitting: Internal Medicine

## 2016-09-15 DIAGNOSIS — Z1231 Encounter for screening mammogram for malignant neoplasm of breast: Secondary | ICD-10-CM | POA: Insufficient documentation

## 2017-03-31 DIAGNOSIS — R7303 Prediabetes: Secondary | ICD-10-CM | POA: Diagnosis not present

## 2017-03-31 DIAGNOSIS — K219 Gastro-esophageal reflux disease without esophagitis: Secondary | ICD-10-CM | POA: Diagnosis not present

## 2017-04-07 DIAGNOSIS — R7301 Impaired fasting glucose: Secondary | ICD-10-CM | POA: Diagnosis not present

## 2017-04-07 DIAGNOSIS — E039 Hypothyroidism, unspecified: Secondary | ICD-10-CM | POA: Diagnosis not present

## 2017-04-07 DIAGNOSIS — N390 Urinary tract infection, site not specified: Secondary | ICD-10-CM | POA: Diagnosis not present

## 2017-04-07 DIAGNOSIS — E663 Overweight: Secondary | ICD-10-CM | POA: Diagnosis not present

## 2017-05-14 ENCOUNTER — Other Ambulatory Visit: Payer: Self-pay | Admitting: Endocrinology

## 2017-05-14 DIAGNOSIS — E041 Nontoxic single thyroid nodule: Secondary | ICD-10-CM

## 2017-05-25 ENCOUNTER — Ambulatory Visit
Admission: RE | Admit: 2017-05-25 | Discharge: 2017-05-25 | Disposition: A | Discharge status: Home / Self Care | Payer: Managed Care, Other (non HMO) | Source: Ambulatory Visit | Attending: Endocrinology | Admitting: Endocrinology

## 2017-05-25 DIAGNOSIS — E559 Vitamin D deficiency, unspecified: Secondary | ICD-10-CM | POA: Diagnosis not present

## 2017-05-25 DIAGNOSIS — E039 Hypothyroidism, unspecified: Secondary | ICD-10-CM | POA: Diagnosis not present

## 2017-05-25 DIAGNOSIS — M79644 Pain in right finger(s): Secondary | ICD-10-CM | POA: Diagnosis not present

## 2017-05-25 DIAGNOSIS — R7301 Impaired fasting glucose: Secondary | ICD-10-CM | POA: Diagnosis not present

## 2017-05-25 DIAGNOSIS — M1990 Unspecified osteoarthritis, unspecified site: Secondary | ICD-10-CM | POA: Diagnosis not present

## 2017-05-25 DIAGNOSIS — E041 Nontoxic single thyroid nodule: Secondary | ICD-10-CM

## 2017-05-25 DIAGNOSIS — M130 Polyarthritis, unspecified: Secondary | ICD-10-CM | POA: Diagnosis not present

## 2017-05-25 DIAGNOSIS — M79671 Pain in right foot: Secondary | ICD-10-CM | POA: Diagnosis not present

## 2017-06-01 DIAGNOSIS — E559 Vitamin D deficiency, unspecified: Secondary | ICD-10-CM | POA: Diagnosis not present

## 2017-06-01 DIAGNOSIS — E041 Nontoxic single thyroid nodule: Secondary | ICD-10-CM | POA: Diagnosis not present

## 2017-06-01 DIAGNOSIS — R7301 Impaired fasting glucose: Secondary | ICD-10-CM | POA: Diagnosis not present

## 2017-06-01 DIAGNOSIS — E039 Hypothyroidism, unspecified: Secondary | ICD-10-CM | POA: Diagnosis not present

## 2017-07-30 DIAGNOSIS — R1013 Epigastric pain: Secondary | ICD-10-CM | POA: Diagnosis not present

## 2017-07-30 DIAGNOSIS — Z800 Family history of malignant neoplasm of digestive organs: Secondary | ICD-10-CM | POA: Diagnosis not present

## 2017-07-30 DIAGNOSIS — K219 Gastro-esophageal reflux disease without esophagitis: Secondary | ICD-10-CM | POA: Diagnosis not present

## 2017-09-15 ENCOUNTER — Other Ambulatory Visit: Payer: Self-pay | Admitting: Obstetrics and Gynecology

## 2017-09-15 DIAGNOSIS — Z1231 Encounter for screening mammogram for malignant neoplasm of breast: Secondary | ICD-10-CM

## 2017-09-28 ENCOUNTER — Ambulatory Visit
Admission: RE | Admit: 2017-09-28 | Discharge: 2017-09-28 | Disposition: A | Discharge status: Home / Self Care | Payer: BLUE CROSS/BLUE SHIELD | Source: Ambulatory Visit | Attending: Obstetrics and Gynecology | Admitting: Obstetrics and Gynecology

## 2017-09-28 DIAGNOSIS — Z1231 Encounter for screening mammogram for malignant neoplasm of breast: Secondary | ICD-10-CM

## 2018-01-25 DIAGNOSIS — M25559 Pain in unspecified hip: Secondary | ICD-10-CM | POA: Diagnosis not present

## 2018-01-25 DIAGNOSIS — E559 Vitamin D deficiency, unspecified: Secondary | ICD-10-CM | POA: Diagnosis not present

## 2018-01-25 DIAGNOSIS — R7301 Impaired fasting glucose: Secondary | ICD-10-CM | POA: Diagnosis not present

## 2018-01-25 DIAGNOSIS — M25561 Pain in right knee: Secondary | ICD-10-CM | POA: Diagnosis not present

## 2018-01-25 DIAGNOSIS — Z79899 Other long term (current) drug therapy: Secondary | ICD-10-CM | POA: Diagnosis not present

## 2018-01-25 DIAGNOSIS — M1990 Unspecified osteoarthritis, unspecified site: Secondary | ICD-10-CM | POA: Diagnosis not present

## 2018-01-25 DIAGNOSIS — M130 Polyarthritis, unspecified: Secondary | ICD-10-CM | POA: Diagnosis not present

## 2018-01-28 DIAGNOSIS — R7303 Prediabetes: Secondary | ICD-10-CM | POA: Diagnosis not present

## 2018-01-28 DIAGNOSIS — M7731 Calcaneal spur, right foot: Secondary | ICD-10-CM | POA: Diagnosis not present

## 2018-01-28 DIAGNOSIS — Z713 Dietary counseling and surveillance: Secondary | ICD-10-CM | POA: Diagnosis not present

## 2018-01-28 DIAGNOSIS — E039 Hypothyroidism, unspecified: Secondary | ICD-10-CM | POA: Diagnosis not present

## 2018-01-28 DIAGNOSIS — Z0000 Encounter for general adult medical examination without abnormal findings: Secondary | ICD-10-CM | POA: Diagnosis not present

## 2018-01-28 DIAGNOSIS — M25552 Pain in left hip: Secondary | ICD-10-CM | POA: Diagnosis not present

## 2018-01-28 DIAGNOSIS — K219 Gastro-esophageal reflux disease without esophagitis: Secondary | ICD-10-CM | POA: Diagnosis not present

## 2018-01-28 DIAGNOSIS — E663 Overweight: Secondary | ICD-10-CM | POA: Diagnosis not present

## 2018-02-03 DIAGNOSIS — E559 Vitamin D deficiency, unspecified: Secondary | ICD-10-CM | POA: Diagnosis not present

## 2018-02-03 DIAGNOSIS — E041 Nontoxic single thyroid nodule: Secondary | ICD-10-CM | POA: Diagnosis not present

## 2018-02-03 DIAGNOSIS — R7301 Impaired fasting glucose: Secondary | ICD-10-CM | POA: Diagnosis not present

## 2018-02-03 DIAGNOSIS — E039 Hypothyroidism, unspecified: Secondary | ICD-10-CM | POA: Diagnosis not present

## 2018-02-04 DIAGNOSIS — Z01419 Encounter for gynecological examination (general) (routine) without abnormal findings: Secondary | ICD-10-CM | POA: Diagnosis not present

## 2018-02-04 DIAGNOSIS — Z6828 Body mass index (BMI) 28.0-28.9, adult: Secondary | ICD-10-CM | POA: Diagnosis not present

## 2018-03-11 DIAGNOSIS — Z713 Dietary counseling and surveillance: Secondary | ICD-10-CM | POA: Diagnosis not present

## 2018-03-22 DIAGNOSIS — Z713 Dietary counseling and surveillance: Secondary | ICD-10-CM | POA: Diagnosis not present

## 2018-04-08 DIAGNOSIS — Z713 Dietary counseling and surveillance: Secondary | ICD-10-CM | POA: Diagnosis not present

## 2018-04-22 DIAGNOSIS — Z713 Dietary counseling and surveillance: Secondary | ICD-10-CM | POA: Diagnosis not present

## 2018-05-07 DIAGNOSIS — M542 Cervicalgia: Secondary | ICD-10-CM | POA: Diagnosis not present

## 2018-05-07 DIAGNOSIS — M4696 Unspecified inflammatory spondylopathy, lumbar region: Secondary | ICD-10-CM | POA: Diagnosis not present

## 2018-05-12 DIAGNOSIS — M6281 Muscle weakness (generalized): Secondary | ICD-10-CM | POA: Diagnosis not present

## 2018-05-12 DIAGNOSIS — M542 Cervicalgia: Secondary | ICD-10-CM | POA: Diagnosis not present

## 2018-05-12 DIAGNOSIS — M25552 Pain in left hip: Secondary | ICD-10-CM | POA: Diagnosis not present

## 2018-05-12 DIAGNOSIS — M545 Low back pain: Secondary | ICD-10-CM | POA: Diagnosis not present

## 2018-10-12 ENCOUNTER — Other Ambulatory Visit: Payer: Self-pay | Admitting: Obstetrics and Gynecology

## 2018-10-12 DIAGNOSIS — Z1231 Encounter for screening mammogram for malignant neoplasm of breast: Secondary | ICD-10-CM

## 2018-11-10 DIAGNOSIS — E039 Hypothyroidism, unspecified: Secondary | ICD-10-CM | POA: Diagnosis not present

## 2018-11-10 DIAGNOSIS — R7301 Impaired fasting glucose: Secondary | ICD-10-CM | POA: Diagnosis not present

## 2018-11-16 ENCOUNTER — Other Ambulatory Visit: Payer: Self-pay

## 2018-11-16 ENCOUNTER — Ambulatory Visit
Admission: RE | Admit: 2018-11-16 | Discharge: 2018-11-16 | Disposition: A | Discharge status: Home / Self Care | Payer: BC Managed Care – PPO | Source: Ambulatory Visit | Attending: Obstetrics and Gynecology | Admitting: Obstetrics and Gynecology

## 2018-11-16 DIAGNOSIS — E559 Vitamin D deficiency, unspecified: Secondary | ICD-10-CM | POA: Diagnosis not present

## 2018-11-16 DIAGNOSIS — E041 Nontoxic single thyroid nodule: Secondary | ICD-10-CM | POA: Diagnosis not present

## 2018-11-16 DIAGNOSIS — Z1231 Encounter for screening mammogram for malignant neoplasm of breast: Secondary | ICD-10-CM | POA: Insufficient documentation

## 2018-11-16 DIAGNOSIS — R7301 Impaired fasting glucose: Secondary | ICD-10-CM | POA: Diagnosis not present

## 2018-11-16 DIAGNOSIS — E039 Hypothyroidism, unspecified: Secondary | ICD-10-CM | POA: Diagnosis not present

## 2020-10-12 IMAGING — MG DIGITAL SCREENING BILATERAL MAMMOGRAM WITH TOMO AND CAD
6 of 10 series · 6 of 30 positions shown · non-contrast
Comparison: Previous exam(s).

CLINICAL DATA: Screening.

EXAM:
DIGITAL SCREENING BILATERAL MAMMOGRAM WITH TOMO AND CAD

[L CC synth-2D]
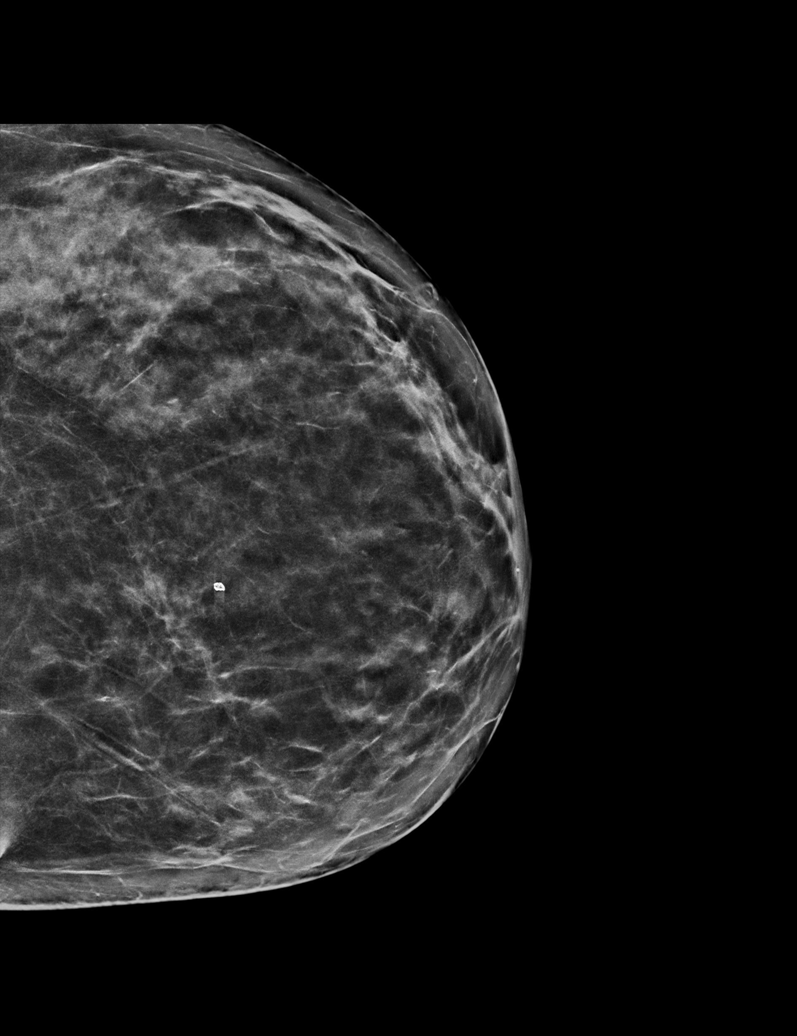

[R MLO synth-2D (1 of 2)]
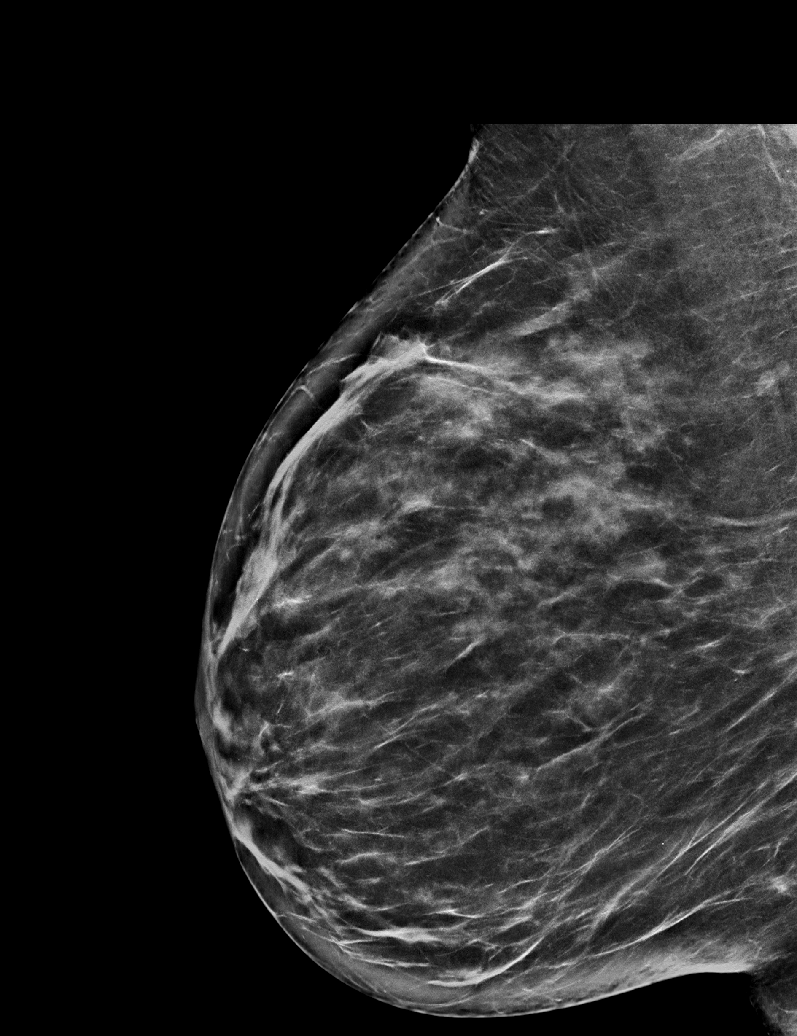

[L MLO synth-2D]
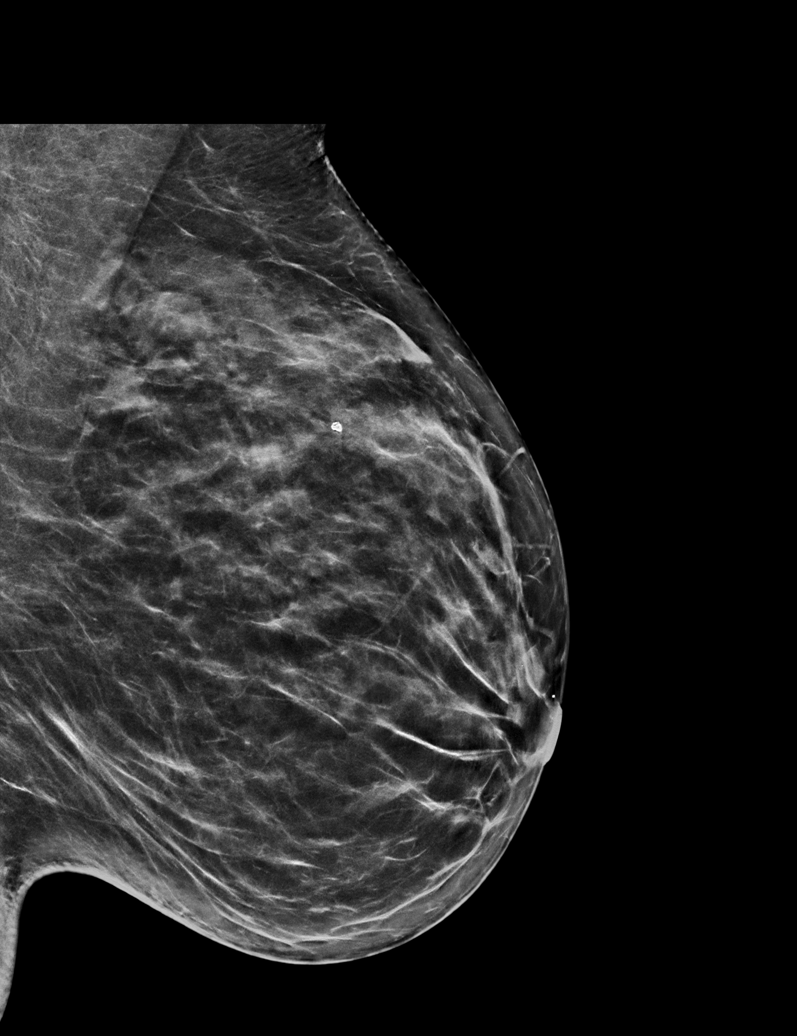

[R CC synth-2D]
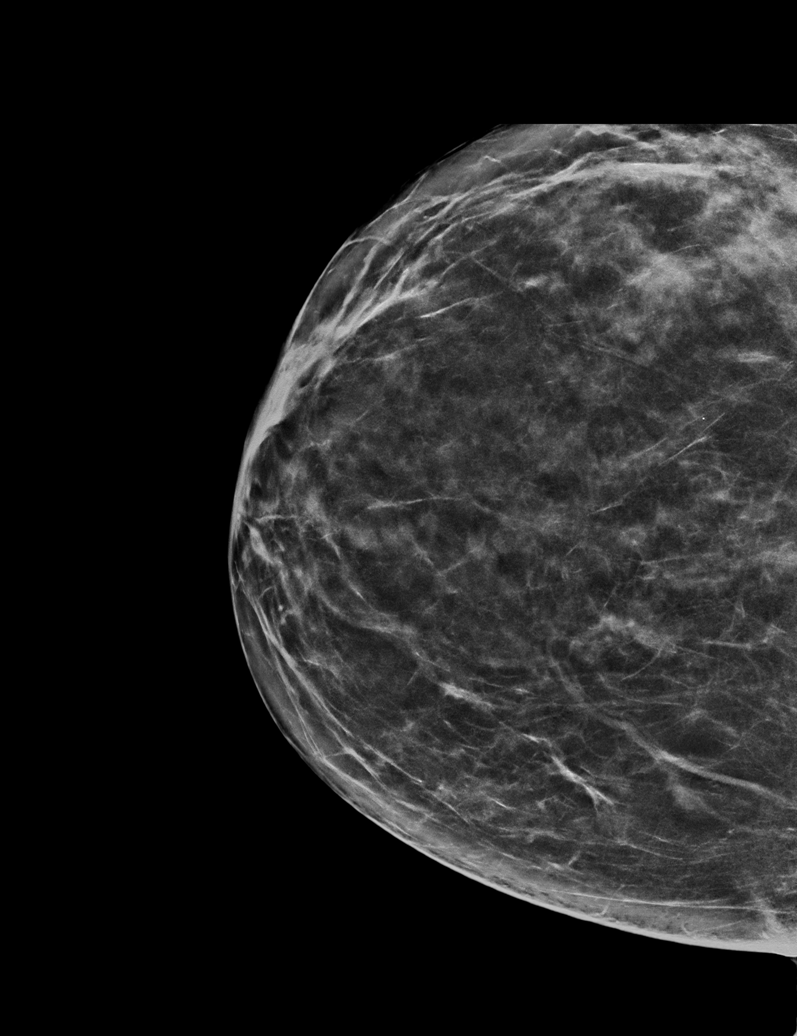

[R MLO synth-2D (2 of 2)]
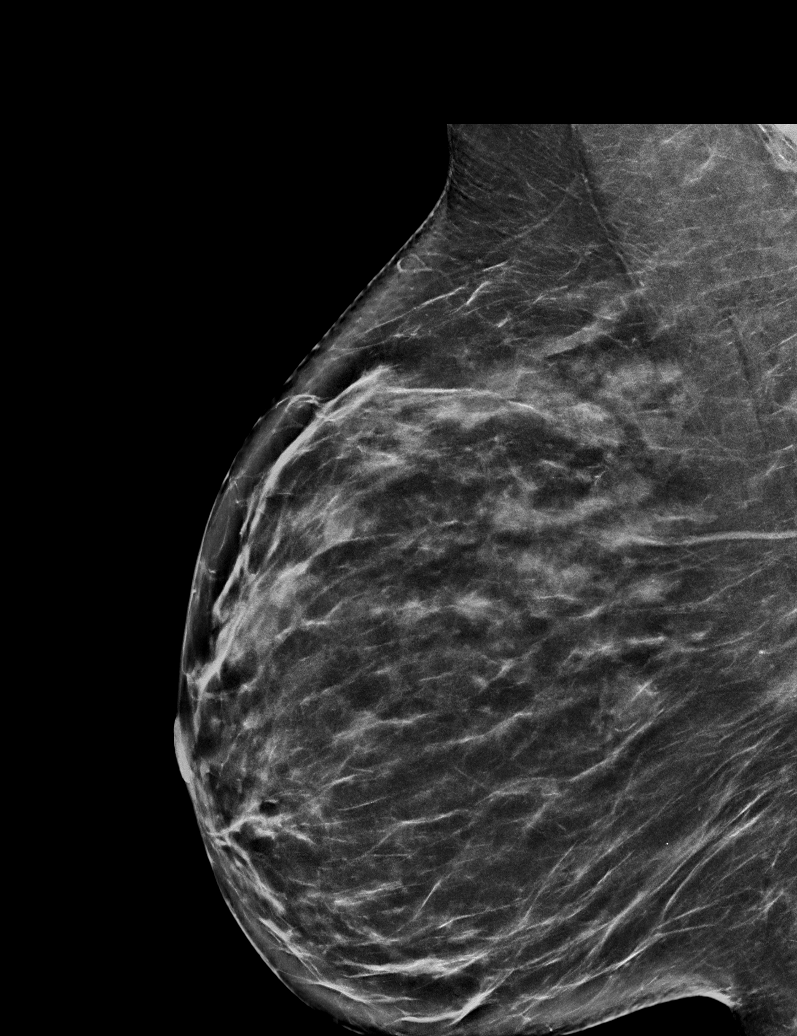

[R CC tomo · tomo slice 35/69.0]
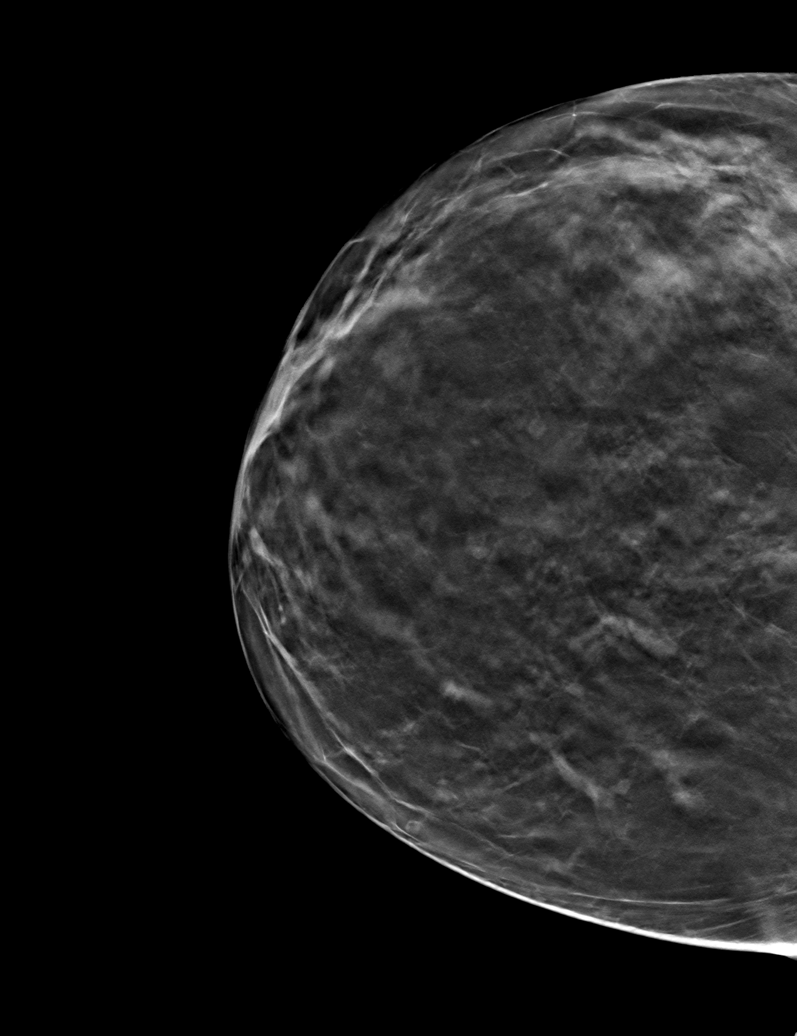

[6 of 30 positions shown; findings below may reference images not displayed]

ACR Breast Density Category c: The breast tissue is heterogeneously
dense, which may obscure small masses.
FINDINGS: There are no findings suspicious for malignancy. Images were
processed with CAD.
IMPRESSION: No mammographic evidence of malignancy. A result letter of this
screening mammogram will be mailed directly to the patient.

RECOMMENDATION:
Screening mammogram in one year. (Code:FT-U-LHB)

BI-RADS CATEGORY  1: Negative.

## 2022-07-31 DIAGNOSIS — H5203 Hypermetropia, bilateral: Secondary | ICD-10-CM | POA: Diagnosis not present

## 2022-07-31 DIAGNOSIS — H2513 Age-related nuclear cataract, bilateral: Secondary | ICD-10-CM | POA: Diagnosis not present

## 2023-06-30 DIAGNOSIS — R7301 Impaired fasting glucose: Secondary | ICD-10-CM | POA: Diagnosis not present

## 2023-06-30 DIAGNOSIS — E041 Nontoxic single thyroid nodule: Secondary | ICD-10-CM | POA: Diagnosis not present

## 2023-06-30 DIAGNOSIS — E559 Vitamin D deficiency, unspecified: Secondary | ICD-10-CM | POA: Diagnosis not present

## 2023-07-01 DIAGNOSIS — E041 Nontoxic single thyroid nodule: Secondary | ICD-10-CM | POA: Diagnosis not present

## 2023-07-01 DIAGNOSIS — M85859 Other specified disorders of bone density and structure, unspecified thigh: Secondary | ICD-10-CM | POA: Diagnosis not present

## 2023-07-01 DIAGNOSIS — R7301 Impaired fasting glucose: Secondary | ICD-10-CM | POA: Diagnosis not present

## 2023-07-01 DIAGNOSIS — E039 Hypothyroidism, unspecified: Secondary | ICD-10-CM | POA: Diagnosis not present

## 2023-07-01 DIAGNOSIS — M8589 Other specified disorders of bone density and structure, multiple sites: Secondary | ICD-10-CM | POA: Diagnosis not present

## 2023-07-14 DIAGNOSIS — M79672 Pain in left foot: Secondary | ICD-10-CM | POA: Diagnosis not present

## 2023-07-14 DIAGNOSIS — M1990 Unspecified osteoarthritis, unspecified site: Secondary | ICD-10-CM | POA: Diagnosis not present

## 2023-07-14 DIAGNOSIS — M489 Spondylopathy, unspecified: Secondary | ICD-10-CM | POA: Diagnosis not present

## 2023-07-14 DIAGNOSIS — M79671 Pain in right foot: Secondary | ICD-10-CM | POA: Diagnosis not present

## 2023-07-14 DIAGNOSIS — M85859 Other specified disorders of bone density and structure, unspecified thigh: Secondary | ICD-10-CM | POA: Diagnosis not present

## 2023-07-14 DIAGNOSIS — E039 Hypothyroidism, unspecified: Secondary | ICD-10-CM | POA: Diagnosis not present

## 2023-07-16 DIAGNOSIS — R7301 Impaired fasting glucose: Secondary | ICD-10-CM | POA: Diagnosis not present
